# Patient Record
Sex: Male | Born: 1975 | Race: Black or African American | Hispanic: No | Marital: Single | Smoking: Current every day smoker
Health system: Southern US, Community
[De-identification: ages and names within clinical notes are randomized; demographics above are authoritative.]

## PROBLEM LIST (undated history)

## (undated) DIAGNOSIS — H5789 Other specified disorders of eye and adnexa: Secondary | ICD-10-CM

## (undated) HISTORY — PX: TONSILLECTOMY: SUR1361

---

## 2010-01-12 ENCOUNTER — Emergency Department (HOSPITAL_COMMUNITY): Admission: EM | Admit: 2010-01-12 | Discharge: 2010-01-12 | Payer: Self-pay | Admitting: Family Medicine

## 2015-10-24 ENCOUNTER — Emergency Department (HOSPITAL_COMMUNITY): Payer: Self-pay

## 2015-10-24 ENCOUNTER — Encounter (HOSPITAL_COMMUNITY): Payer: Self-pay | Admitting: Emergency Medicine

## 2015-10-24 ENCOUNTER — Emergency Department (HOSPITAL_COMMUNITY)
Admission: EM | Admit: 2015-10-24 | Discharge: 2015-10-25 | Disposition: A | Discharge status: Home / Self Care | Payer: Self-pay | Attending: Emergency Medicine | Admitting: Emergency Medicine

## 2015-10-24 DIAGNOSIS — Z79899 Other long term (current) drug therapy: Secondary | ICD-10-CM | POA: Insufficient documentation

## 2015-10-24 DIAGNOSIS — Y998 Other external cause status: Secondary | ICD-10-CM | POA: Insufficient documentation

## 2015-10-24 DIAGNOSIS — Y9289 Other specified places as the place of occurrence of the external cause: Secondary | ICD-10-CM | POA: Insufficient documentation

## 2015-10-24 DIAGNOSIS — S0101XA Laceration without foreign body of scalp, initial encounter: Secondary | ICD-10-CM | POA: Insufficient documentation

## 2015-10-24 DIAGNOSIS — S50812A Abrasion of left forearm, initial encounter: Secondary | ICD-10-CM | POA: Insufficient documentation

## 2015-10-24 DIAGNOSIS — Y9389 Activity, other specified: Secondary | ICD-10-CM | POA: Insufficient documentation

## 2015-10-24 DIAGNOSIS — S50811A Abrasion of right forearm, initial encounter: Secondary | ICD-10-CM | POA: Insufficient documentation

## 2015-10-24 DIAGNOSIS — F17210 Nicotine dependence, cigarettes, uncomplicated: Secondary | ICD-10-CM | POA: Insufficient documentation

## 2015-10-24 DIAGNOSIS — Z8669 Personal history of other diseases of the nervous system and sense organs: Secondary | ICD-10-CM | POA: Insufficient documentation

## 2015-10-24 DIAGNOSIS — S51012A Laceration without foreign body of left elbow, initial encounter: Secondary | ICD-10-CM | POA: Insufficient documentation

## 2015-10-24 HISTORY — DX: Other specified disorders of eye and adnexa: H57.89

## 2015-10-24 MED ORDER — LIDOCAINE-EPINEPHRINE 2 %-1:100000 IJ SOLN
20.0000 mL | Freq: Once | INTRAMUSCULAR | Status: AC
  Administered 2015-10-24: 20 mL
  Filled 2015-10-24: qty 1
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
## 2015-10-25 MED ORDER — OXYCODONE-ACETAMINOPHEN 5-325 MG PO TABS: 1.0000 | ORAL_TABLET | ORAL | Status: DC | PRN

## 2015-10-25 MED ORDER — BACITRACIN ZINC 500 UNIT/GM EX OINT
TOPICAL_OINTMENT | CUTANEOUS | Status: AC
  Administered 2015-10-25: 1
  Filled 2015-10-25: qty 0.9
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
## 2015-10-31 ENCOUNTER — Encounter (HOSPITAL_COMMUNITY): Payer: Self-pay | Admitting: Emergency Medicine

## 2015-10-31 ENCOUNTER — Emergency Department (HOSPITAL_COMMUNITY)
Admission: EM | Admit: 2015-10-31 | Discharge: 2015-10-31 | Disposition: A | Discharge status: Home / Self Care | Payer: Self-pay | Attending: Emergency Medicine | Admitting: Emergency Medicine

## 2015-10-31 DIAGNOSIS — Z4802 Encounter for removal of sutures: Secondary | ICD-10-CM | POA: Insufficient documentation

## 2015-10-31 DIAGNOSIS — Z79899 Other long term (current) drug therapy: Secondary | ICD-10-CM | POA: Insufficient documentation

## 2015-10-31 DIAGNOSIS — F17210 Nicotine dependence, cigarettes, uncomplicated: Secondary | ICD-10-CM | POA: Insufficient documentation

## 2015-10-31 NOTE — ED Notes (Signed)
Pt was seen here 1 week ago and had staples placed in his head and stitches placed in his elbow. Pt following up to have them removed. A&Ox4 and ambulatory. Denies any issues with sutures or staples.

## 2015-10-31 NOTE — Discharge Instructions (Signed)
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
## 2015-11-04 ENCOUNTER — Emergency Department (HOSPITAL_COMMUNITY)
Admission: EM | Admit: 2015-11-04 | Discharge: 2015-11-04 | Disposition: A | Discharge status: Home / Self Care | Payer: Self-pay | Attending: Emergency Medicine | Admitting: Emergency Medicine

## 2015-11-04 ENCOUNTER — Encounter (HOSPITAL_COMMUNITY): Payer: Self-pay

## 2015-11-04 DIAGNOSIS — Z8669 Personal history of other diseases of the nervous system and sense organs: Secondary | ICD-10-CM | POA: Insufficient documentation

## 2015-11-04 DIAGNOSIS — Z79899 Other long term (current) drug therapy: Secondary | ICD-10-CM | POA: Insufficient documentation

## 2015-11-04 DIAGNOSIS — F17210 Nicotine dependence, cigarettes, uncomplicated: Secondary | ICD-10-CM | POA: Insufficient documentation

## 2015-11-04 DIAGNOSIS — Z4802 Encounter for removal of sutures: Secondary | ICD-10-CM | POA: Insufficient documentation

## 2015-11-04 NOTE — Discharge Instructions (Signed)
Continue to keep it clean. Follow up as needed.    Suture Removal, Care After Refer to this sheet in the next few weeks. These instructions provide you with information on caring for yourself after your procedure. Your health care provider may also give you more specific instructions. Your treatment has been planned according to current medical practices, but problems sometimes occur. Call your health care provider if you have any problems or questions after your procedure. WHAT TO EXPECT AFTER THE PROCEDURE After your stitches (sutures) are removed, it is typical to have the following:  Some discomfort and swelling in the wound area.  Slight redness in the area. HOME CARE INSTRUCTIONS   If you have skin adhesive strips over the wound area, do not take the strips off. They will fall off on their own in a few days. If the strips remain in place after 14 days, you may remove them.  Change any bandages (dressings) at least once a day or as directed by your health care provider. If the bandage sticks, soak it off with warm, soapy water.  Apply cream or ointment only as directed by your health care provider. If using cream or ointment, wash the area with soap and water 2 times a day to remove all the cream or ointment. Rinse off the soap and pat the area dry with a clean towel.  Keep the wound area dry and clean. If the bandage becomes wet or dirty, or if it develops a bad smell, change it as soon as possible.  Continue to protect the wound from injury.  Use sunscreen when out in the sun. New scars become sunburned easily. SEEK MEDICAL CARE IF:  You have increasing redness, swelling, or pain in the wound.  You see pus coming from the wound.  You have a fever.  You notice a bad smell coming from the wound or dressing.  Your wound breaks open (edges not staying together).   This information is not intended to replace advice given to you by your health care provider. Make sure you discuss  any questions you have with your health care provider.   Document Released: 08/14/2001 Document Revised: 09/09/2013 Document Reviewed: 07/01/2013 Elsevier Interactive Patient Education [employer]2016 Elsevier Inc.

## 2015-11-04 NOTE — ED Provider Notes (Signed)
CSN: [id number]     Arrival date & time 11/04/15  [id number] History   First MD Initiated Contact with Patient 11/04/15 0900     Chief Complaint  Patient presents with  . Suture / Staple Removal     (Consider location/radiation/quality/duration/timing/severity/associated sxs/prior Treatment) HPI Kenneth Davies is a 39 y.o. male with no medical problems presents to emergency department requesting suture removal. Patient cut his elbow 10 days ago. He was seen here 3 days ago, had 2 of the sutures removed, and was told to come back to remove the rest. Patient states wound is healing well. No drainage. No redness. Denies any swelling. Denies any tenderness. He has been putting antibiotic ointment and covering with bandages. Denies any other complaints.   Past Medical History  Diagnosis Date  . Eye inflammation    Past Surgical History  Procedure Laterality Date  . Tonsillectomy     History reviewed. No pertinent family history. Social History  Substance Use Topics  . Smoking status: Current Every Day Smoker -- 0.50 packs/day for 15 years    Types: Cigarettes  . Smokeless tobacco: Never Used  . Alcohol Use: Yes    Review of Systems  Constitutional: Negative for fever and chills.  Musculoskeletal: Negative for arthralgias.  Skin: Positive for wound.      Allergies  Review of patient's allergies indicates no known allergies.  Home Medications   Prior to Admission medications   Medication Sig Start Date End Date Taking? Authorizing Provider  acyclovir (ZOVIRAX) 400 MG tablet Take 400 mg by mouth 2 (two) times daily. 09/14/15   Historical Provider, MD  oxyCODONE-acetaminophen (PERCOCET/ROXICET) 5-325 MG tablet Take 1-2 tablets by mouth every 4 (four) hours as needed for severe pain. 10/25/15   Shawn C Joy, PA-C   BP 118/70 mmHg  Pulse 75  Temp(Src) 97.5 F (36.4 C) (Oral)  Resp 14  SpO2 100% Physical Exam  Constitutional: He appears well-developed and well-nourished. No  distress.  Musculoskeletal:  Full rom of the left elbow  Skin: Skin is warm and dry.  Healing laceration to the left elbow, 3 sutures intact. Dermabond noted over the wound. No tenderness to palpation, no erythema, no drainage or dehiscence.  Nursing note and vitals reviewed.   ED Course  Procedures (including critical care time) Labs Review Labs Reviewed - No data to display  Imaging Review No results found. I have personally reviewed and evaluated these images and lab results as part of my medical decision-making.   EKG Interpretation None      MDM   Final diagnoses:  Visit for suture removal   SUTURE REMOVAL Performed by: Jaynie CrumbleKIRICHENKO, Sakura Denis A Consent: Verbal consent obtained. Patient identity confirmed: provided demographic data Time out: Immediately prior to procedure a "time out" was called to verify the correct patient, procedure, equipment, support staff and site/side marked as required. Location: left elbow Wound Appearance: clean Sutures/Staples Removed: 3 Patient tolerance: Patient tolerated the procedure well with no immediate complications.    9:24 AM Sutures removed. Would healing well with no signs of infection or dehiscence. Sterri strips applied for some support since on the joint. Follow up as needed.   ; Filed Vitals:   11/04/15 0857  BP: 118/70  Pulse: 75  Temp: 97.5 F (36.4 C)  Resp: [address]     [name], PA-C 11/04/15 [id number]  [name] Plunkett, MD 11/04/15 1501

## 2015-11-04 NOTE — ED Notes (Signed)
Pt here for remaining sutures removed from elbow.

## 2019-03-31 ENCOUNTER — Other Ambulatory Visit: Payer: Self-pay

## 2019-03-31 ENCOUNTER — Ambulatory Visit (INDEPENDENT_AMBULATORY_CARE_PROVIDER_SITE_OTHER): Payer: BLUE CROSS/BLUE SHIELD | Admitting: Medical

## 2019-03-31 ENCOUNTER — Encounter: Payer: Self-pay | Admitting: Medical

## 2019-03-31 ENCOUNTER — Ambulatory Visit (INDEPENDENT_AMBULATORY_CARE_PROVIDER_SITE_OTHER)
Admission: RE | Admit: 2019-03-31 | Discharge: 2019-03-31 | Disposition: A | Discharge status: Home / Self Care | Payer: BLUE CROSS/BLUE SHIELD | Source: Ambulatory Visit | Attending: Medical | Admitting: Medical

## 2019-03-31 DIAGNOSIS — M79645 Pain in left finger(s): Secondary | ICD-10-CM

## 2019-03-31 MED ORDER — MELOXICAM 7.5 MG PO TABS
7.5000 mg | ORAL_TABLET | Freq: Every day | ORAL | 0 refills | Status: DC
Start: 2019-03-31 — End: 2019-08-19

## 2019-03-31 NOTE — Patient Instructions (Signed)
Pt has history of injury to left thumb/hand for one month and not resolving. Concern for tenosynovitis of left thumb in extensor tendon but limitted flexion of thumb as well. So will get xray today and refer to sports medicine. He can use meloxicam for pain an inflammation. But check bp at pharmacy and let me know if bp over 140/90 since nsaids can increase bp.  Note may need just thumb spica but [place] of thumb can be done to evaluate tendon.  Follow up date to be determined.  Also asked to schedule wellness in mid summer.

## 2019-03-31 NOTE — Progress Notes (Signed)
   Subjective:    Patient ID: Kenneth Davies, male    DOB: January 06, 1976, 43 y.o.   MRN: [id number]  HPI  Virtual Visit via Video Note  I connected with Irene Pap on 03/31/19 at  2:00 PM EDT by a video enabled telemedicine application and verified that I am speaking with the correct person using two identifiers.   I discussed the limitations of evaluation and management by telemedicine and the availability of in person appointments. The patient expressed understanding and agreed to proceed.   No vitals. Pt was at home during visit. I was also. Virtual visit due to covid pandemic. History of Present Illness:  No chronic medical medical. 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  Pt does not exericse regularly. States drinks alcohol quite a bit. 3-4 shot of alcohol a day. Smoke- pack a day recently . Lighter since early 43 yo.  Pt has some left hand pain. He fell on coffee table almost a month ago. He states pain on tope of pain. This is in his dominant hand. Pt has some difficulty fully flexing his thumb.   When first injured the area it was really swollen. Did not want to move it. It was bruised as well.   Observations/Objective: No acute distress. Normal affect. Left hand- normal on inspection. Thumb can't flex fully. But some pain on top of thumb. Some + finklestein test.   Assessment and Plan:  Pt has history of injury to left thumb/hand for one month and not resolving. Concern for tenosynovitis of left thumb in extensor tendon but limitted flexion of thumb as well. So will get xray today and refer to sports medicine. He can use meloxicam for pain an inflammation. But check bp at pharmacy and let me know if bp over 140/90 since nsaids can increase bp.  Note may need just thumb spica but [place] of thumb can be done to evaluate tendon.  Follow up date to be determined.  Also asked to schedule wellness in mid summer.  Esperanza Richters, PA-C Follow Up Instructions:    I discussed  the assessment and treatment plan with the patient. The patient was provided an opportunity to ask questions and all were answered. The patient agreed with the plan and demonstrated an understanding of the instructions.   The patient was advised to call back or seek an in-person evaluation if the symptoms worsen or if the condition fails to improve as anticipated.     Esperanza Richters, PA-C    Review of Systems  Constitutional: Negative for chills, diaphoresis, fatigue and fever.  Respiratory: Negative for cough, chest tightness, shortness of breath and wheezing.   Cardiovascular: Negative for chest pain.  Gastrointestinal: Negative for abdominal pain.  Genitourinary: Negative for dysuria, flank pain, frequency and urgency.  Musculoskeletal: Negative for back pain, neck pain and neck stiffness.  Skin: Negative for rash.  Neurological: Negative for dizziness, speech difficulty and weakness.  Hematological: Negative for adenopathy. Does not bruise/bleed easily.  Psychiatric/Behavioral: Negative for agitation and behavioral problems.       Objective:   Physical Exam        Assessment & Plan:

## 2019-04-01 ENCOUNTER — Encounter: Payer: Self-pay | Admitting: Medical

## 2019-04-07 ENCOUNTER — Ambulatory Visit (INDEPENDENT_AMBULATORY_CARE_PROVIDER_SITE_OTHER): Payer: BLUE CROSS/BLUE SHIELD | Admitting: Family Medicine

## 2019-04-07 ENCOUNTER — Ambulatory Visit: Payer: Self-pay

## 2019-04-07 ENCOUNTER — Other Ambulatory Visit: Payer: Self-pay

## 2019-04-07 ENCOUNTER — Encounter: Payer: Self-pay | Admitting: Family Medicine

## 2019-04-07 VITALS — BP 147/87 | HR 73 | Ht 67.0 in | Wt 135.0 lb

## 2019-04-07 DIAGNOSIS — S63642A Sprain of metacarpophalangeal joint of left thumb, initial encounter: Secondary | ICD-10-CM | POA: Diagnosis not present

## 2019-04-07 DIAGNOSIS — G8929 Other chronic pain: Secondary | ICD-10-CM

## 2019-04-07 DIAGNOSIS — M79645 Pain in left finger(s): Principal | ICD-10-CM

## 2019-04-07 DIAGNOSIS — S63649A Sprain of metacarpophalangeal joint of unspecified thumb, initial encounter: Secondary | ICD-10-CM | POA: Insufficient documentation

## 2019-04-07 MED ORDER — DICLOFENAC SODIUM 2 % TD SOLN: 1.0000 "application " | Freq: Two times a day (BID) | TRANSDERMAL | 2 refills | Status: AC

## 2019-04-07 NOTE — Patient Instructions (Addendum)
Nice to meet you  Please use the thumb spica brace while you are using your hand. You don't need to use it at night.  Please try the rub on medicine.  Please start trying the exercises in 2 weeks.  Please see me back in one month. Please send me a message in MyChart with any questions or updates.

## 2019-04-07 NOTE — Progress Notes (Signed)
Kenneth Davies - 43 y.o. male MRN [id number]  Date of birth: Aug 22, 1976  SUBJECTIVE:  Including CC & ROS.  Chief Complaint  Patient presents with  . Hand Pain    left thumb x 1 months    Kenneth Davies is a 43 y.o. male that is presenting with left ulnar-sided thumb pain.  Has been going on about a month.  He initially fell and had a hyperextension of his thumb.  He is tried resting with no improvement.  The pain is worse with excessive use.  He is left-handed.  The pain is intermediate in nature.  Is localized to the MP joint of the thumb.  Has had some improvement with the meloxicam.  Denies any history of similar symptoms.  Feels like his symptoms are currently staying the same.  He works as a Financial risk analyst.  The pain is sharp and stabbing in nature.  Independent review of the left hand x-ray from 4/28 shows no abnormalities.   Review of Systems  Constitutional: Negative for fever.  HENT: Negative for congestion.   Respiratory: Negative for cough.   Cardiovascular: Negative for chest pain.  Gastrointestinal: Negative for abdominal pain.  Musculoskeletal: Negative for gait problem.  Skin: Negative for color change.  Neurological: Negative for weakness.  Hematological: Negative for adenopathy.    HISTORY: Past Medical, Surgical, Social, and Family History Reviewed & Updated per EMR.   Pertinent Historical Findings include:  Past Medical History:  Diagnosis Date  . Eye inflammation     Past Surgical History:  Procedure Laterality Date  . TONSILLECTOMY      No Known Allergies  No family history on file.   Social History   Socioeconomic History  . Marital status: Single    Spouse name: Not on file  . Number of children: Not on file  . Years of education: Not on file  . Highest education level: Not on file  Occupational History  . Not on file  Social Needs  . Financial resource strain: Not on file  . Food insecurity:    Worry: Not on file    Inability: Not on file  .  Transportation needs:    Medical: Not on file    Non-medical: Not on file  Tobacco Use  . Smoking status: Current Every Day Smoker    Packs/day: 0.50    Years: 15.00    Pack years: 7.50    Types: Cigarettes  . Smokeless tobacco: Never Used  Substance and Sexual Activity  . Alcohol use: Yes    Comment: 3 shots a day.  . Drug use: Yes    Frequency: 3.0 times per week    Types: Marijuana  . Sexual activity: Yes  Lifestyle  . Physical activity:    Days per week: Not on file    Minutes per session: Not on file  . Stress: Not on file  Relationships  . Social connections:    Talks on phone: Not on file    Gets together: Not on file    Attends religious service: Not on file    Active member of club or organization: Not on file    Attends meetings of clubs or organizations: Not on file    Relationship status: Not on file  . Intimate partner violence:    Fear of current or ex partner: Not on file    Emotionally abused: Not on file    Physically abused: Not on file    Forced sexual activity: Not on file  Other Topics Concern  . Not on file  Social History Narrative  . Not on file     PHYSICAL EXAM:  VS: BP (!) 147/87   Pulse 73   Ht 5\' 7"  (1.702 m)   Wt 135 lb (61.2 kg)   BMI 21.14 kg/m  Physical Exam Gen: NAD, alert, cooperative with exam, well-appearing ENT: normal lips, normal nasal mucosa,  Eye: normal EOM, normal conjunctiva and lids CV:  no edema, +2 pedal pulses   Resp: no accessory muscle use, non-labored,   Skin: no rashes, no areas of induration  Neuro: normal tone, normal sensation to touch Psych:  normal insight, alert and oriented MSK:  Left thumb: No effusion. Normal flexion extension. Limited opposition compared to the contralateral side. Some instability with ulnar deviation. Some tenderness palpation of the MP joint. Neurovascular intact  Limited ultrasound: Left thumb:  Mild degenerative changes at [facility]. Effusion of the MP joint.  Insertion of the UCL appears to have a small tear with no significant hypervascularity  Summary: Findings would suggest a gamekeepers or skiers thumb.  Ultrasound and interpretation by Clare GandyJeremy Teeghan Hammer, MD    ASSESSMENT & PLAN:   Gamekeeper's thumb Had a trauma about a month ago with hyperextension of the thumb.  Findings would suggest a gamekeepers or skiers thumb.  Possible to have the capsule of the MP joint strained. -Placed in a thumb spica splint. -Provided Pennsaid. -Counseled on home exercise therapy and can start those in a couple of weeks. -Follow-up in 1 month.  If no improvement need to consider MRI.

## 2019-04-07 NOTE — Assessment & Plan Note (Signed)
Had a trauma about a month ago with hyperextension of the thumb.  Findings would suggest a gamekeepers or skiers thumb.  Possible to have the capsule of the MP joint strained. -Placed in a thumb spica splint. -Provided Pennsaid. -Counseled on home exercise therapy and can start those in a couple of weeks. -Follow-up in 1 month.  If no improvement need to consider MRI.

## 2019-04-13 ENCOUNTER — Encounter: Payer: Self-pay | Admitting: Family Medicine

## 2019-05-05 ENCOUNTER — Ambulatory Visit (INDEPENDENT_AMBULATORY_CARE_PROVIDER_SITE_OTHER): Payer: BC Managed Care – PPO | Admitting: Family Medicine

## 2019-05-05 ENCOUNTER — Other Ambulatory Visit: Payer: Self-pay

## 2019-05-05 ENCOUNTER — Encounter: Payer: Self-pay | Admitting: Family Medicine

## 2019-05-05 DIAGNOSIS — S63642D Sprain of metacarpophalangeal joint of left thumb, subsequent encounter: Secondary | ICD-10-CM

## 2019-05-05 DIAGNOSIS — S63649A Sprain of metacarpophalangeal joint of unspecified thumb, initial encounter: Secondary | ICD-10-CM

## 2019-05-05 NOTE — Patient Instructions (Signed)
Good to see you Please start the range of motion exercises. You will want to perform these before you start cooking. Please try to ice after  Please wear the brace if you are lifting or your thumb could be bent in a different direction   Please send me a message in MyChart with any questions or updates.  Please see me back as needed.   --Dr. Jordan Likes

## 2019-05-05 NOTE — Progress Notes (Signed)
Kenneth Davies - 43 y.o. male MRN [id number]  Date of birth: 01/30/1976  SUBJECTIVE:  Including CC & ROS.  Chief Complaint  Patient presents with  . Follow-up    follow up for left thumb    Kenneth Davies is a 43 y.o. male that is following up for left finger pain.  He has had significant improvement of his symptoms.  He reports some numbness on the radial aspect of his thumb.  He is wearing the thumb spica splint with improvement.  The pain is almost completely gone.  He is also been using the Pennsaid.  He works as a Financial risk analyst.  Symptoms are still localized to the thumb..    Review of Systems  Constitutional: Negative for fever.  HENT: Negative for congestion.   Respiratory: Negative for cough.   Cardiovascular: Negative for chest pain.  Gastrointestinal: Negative for abdominal pain.  Musculoskeletal: Negative for gait problem.  Skin: Negative for color change.  Neurological: Negative for weakness.  Hematological: Negative for adenopathy.    HISTORY: Past Medical, Surgical, Social, and Family History Reviewed & Updated per EMR.   Pertinent Historical Findings include:  Past Medical History:  Diagnosis Date  . Eye inflammation     Past Surgical History:  Procedure Laterality Date  . TONSILLECTOMY      No Known Allergies  No family history on file.   Social History   Socioeconomic History  . Marital status: Single    Spouse name: Not on file  . Number of children: Not on file  . Years of education: Not on file  . Highest education level: Not on file  Occupational History  . Not on file  Social Needs  . Financial resource strain: Not on file  . Food insecurity:    Worry: Not on file    Inability: Not on file  . Transportation needs:    Medical: Not on file    Non-medical: Not on file  Tobacco Use  . Smoking status: Current Every Day Smoker    Packs/day: 0.50    Years: 15.00    Pack years: 7.50    Types: Cigarettes  . Smokeless tobacco: Never Used   Substance and Sexual Activity  . Alcohol use: Yes    Comment: 3 shots a day.  . Drug use: Yes    Frequency: 3.0 times per week    Types: Marijuana  . Sexual activity: Yes  Lifestyle  . Physical activity:    Days per week: Not on file    Minutes per session: Not on file  . Stress: Not on file  Relationships  . Social connections:    Talks on phone: Not on file    Gets together: Not on file    Attends religious service: Not on file    Active member of club or organization: Not on file    Attends meetings of clubs or organizations: Not on file    Relationship status: Not on file  . Intimate partner violence:    Fear of current or ex partner: Not on file    Emotionally abused: Not on file    Physically abused: Not on file    Forced sexual activity: Not on file  Other Topics Concern  . Not on file  Social History Narrative  . Not on file     PHYSICAL EXAM:  VS: BP 138/86   Pulse 76   Ht 5\' 7"  (1.702 m)   Wt 130 lb (59 kg)   BMI 20.36  kg/m  Physical Exam Gen: NAD, alert, cooperative with exam, well-appearing ENT: normal lips, normal nasal mucosa,  Eye: normal EOM, normal conjunctiva and lids CV:  no edema, +2 pedal pulses   Resp: no accessory muscle use, non-labored,   Skin: no rashes, no areas of induration  Neuro: normal tone, normal sensation to touch Psych:  normal insight, alert and oriented MSK:  Left thumb: No swelling or ecchymosis. No specific spot of tenderness. Normal range of motion. No instability with valgus or varus stress testing. Normal strength resistance. Neurovascular intact     ASSESSMENT & PLAN:   Gamekeeper's thumb Seems to have improved with splinting. -Can continue splinting and slowly wean out. -Can continue Pennsaid as needed. -Counseled on home exercise therapy and supportive care. -Can follow-up as needed.

## 2019-05-05 NOTE — Assessment & Plan Note (Signed)
Seems to have improved with splinting. -Can continue splinting and slowly wean out. -Can continue Pennsaid as needed. -Counseled on home exercise therapy and supportive care. -Can follow-up as needed.

## 2019-08-19 ENCOUNTER — Other Ambulatory Visit: Payer: Self-pay

## 2019-08-19 ENCOUNTER — Encounter: Payer: Self-pay | Admitting: Medical

## 2019-08-19 ENCOUNTER — Ambulatory Visit (INDEPENDENT_AMBULATORY_CARE_PROVIDER_SITE_OTHER): Payer: BC Managed Care – PPO | Admitting: Medical

## 2019-08-19 VITALS — BP 140/80 | HR 54 | Temp 98.3°F | Resp 16 | Ht 67.0 in | Wt 131.6 lb

## 2019-08-19 DIAGNOSIS — Z0000 Encounter for general adult medical examination without abnormal findings: Secondary | ICD-10-CM

## 2019-08-19 DIAGNOSIS — R5383 Other fatigue: Secondary | ICD-10-CM | POA: Diagnosis not present

## 2019-08-19 LAB — COMPREHENSIVE METABOLIC PANEL
ALT: 11 U/L (ref 0–53)
AST: 29 U/L (ref 0–37)
Albumin: 4.5 g/dL (ref 3.5–5.2)
Alkaline Phosphatase: 86 U/L (ref 39–117)
BUN: 12 mg/dL (ref 6–23)
CO2: 28 mEq/L (ref 19–32)
Calcium: 9.9 mg/dL (ref 8.4–10.5)
Chloride: 104 mEq/L (ref 96–112)
Creatinine, Ser: 0.88 mg/dL (ref 0.40–1.50)
GFR: 114.52 mL/min (ref >60.00)
Glucose, Bld: 88 mg/dL (ref 70–99)
Potassium: 4.3 mEq/L (ref 3.5–5.1)
Sodium: 140 mEq/L (ref 135–145)
Total Bilirubin: 0.5 mg/dL (ref 0.2–1.2)
Total Protein: 7.1 g/dL (ref 6.0–8.3)

## 2019-08-19 LAB — CBC WITH DIFFERENTIAL/PLATELET
Basophils Absolute: 0 10*3/uL (ref 0.0–0.1)
Basophils Relative: 0.4 % (ref 0.0–3.0)
Eosinophils Absolute: 0.3 10*3/uL (ref 0.0–0.7)
Eosinophils Relative: 5.2 % — ABNORMAL HIGH (ref 0.0–5.0)
HCT: 44.4 % (ref 39.0–52.0)
Hemoglobin: 15.1 g/dL (ref 13.0–17.0)
Lymphocytes Relative: 27.8 % (ref 12.0–46.0)
Lymphs Abs: 1.5 10*3/uL (ref 0.7–4.0)
MCHC: 34 g/dL (ref 30.0–36.0)
MCV: 98.4 fl (ref 78.0–100.0)
Monocytes Absolute: 0.5 10*3/uL (ref 0.1–1.0)
Monocytes Relative: 9 % (ref 3.0–12.0)
Neutro Abs: 3.2 10*3/uL (ref 1.4–7.7)
Neutrophils Relative %: 57.6 % (ref 43.0–77.0)
Platelets: 305 10*3/uL (ref 150.0–400.0)
RBC: 4.51 Mil/uL (ref 4.22–5.81)
RDW: 14.8 % (ref 11.5–15.5)
WBC: 5.5 10*3/uL (ref 4.0–10.5)

## 2019-08-19 LAB — VITAMIN B12: Vitamin B-12: 285 pg/mL (ref 211–911)

## 2019-08-19 LAB — LIPID PANEL
Cholesterol: 161 mg/dL (ref 0–200)
HDL: 91.8 mg/dL (ref >39.00)
LDL Cholesterol: 58 mg/dL (ref 0–99)
NonHDL: 69.01
Total CHOL/HDL Ratio: 2
Triglycerides: 57 mg/dL (ref 0.0–149.0)
VLDL: 11.4 mg/dL (ref 0.0–40.0)

## 2019-08-19 LAB — TSH: TSH: 2.55 u[IU]/mL (ref 0.35–4.50)

## 2019-08-19 MED ORDER — BUPROPION HCL ER (XL) 150 MG PO TB24: 150.0000 mg | ORAL_TABLET | Freq: Every day | ORAL | 1 refills | Status: DC

## 2019-08-19 NOTE — Patient Instructions (Addendum)
For you wellness exam today I have ordered cbc, cmp, tsh, b12, b1 and  lipid panel.  Flu vaccine today.  Recommend exercise and healthy diet.  We will let you know lab results as they come in.  Follow up date appointment will be determined after lab review.   For smoking cessation, rx wellbutrin. Rx advisement given.  Follow up one month or as needed.  Pt bp is borderline. Asked to check 3 times a week. Will review on follow up.   Preventive Care 35-48 Years Old, Male Preventive care refers to lifestyle choices and visits with your health care provider that can promote health and wellness. This includes:  A yearly physical exam. This is also called an annual well check.  Regular dental and eye exams.  Immunizations.  Screening for certain conditions.  Healthy lifestyle choices, such as eating a healthy diet, getting regular exercise, not using drugs or products that contain nicotine and tobacco, and limiting alcohol use. What can I expect for my preventive care visit? Physical exam Your health care provider will check:  Height and weight. These may be used to calculate body mass index (BMI), which is a measurement that tells if you are at a healthy weight.  Heart rate and blood pressure.  Your skin for abnormal spots. Counseling Your health care provider may ask you questions about:  Alcohol, tobacco, and drug use.  Emotional well-being.  Home and relationship well-being.  Sexual activity.  Eating habits.  Work and work Statistician. What immunizations do I need?  Influenza (flu) vaccine  This is recommended every year. Tetanus, diphtheria, and pertussis (Tdap) vaccine  You may need a Td booster every 10 years. Varicella (chickenpox) vaccine  You may need this vaccine if you have not already been vaccinated. Zoster (shingles) vaccine  You may need this after age 38. Measles, mumps, and rubella (MMR) vaccine  You may need at least one dose of MMR if  you were born in 1957 or later. You may also need a second dose. Pneumococcal conjugate (PCV13) vaccine  You may need this if you have certain conditions and were not previously vaccinated. Pneumococcal polysaccharide (PPSV23) vaccine  You may need one or two doses if you smoke cigarettes or if you have certain conditions. Meningococcal conjugate (MenACWY) vaccine  You may need this if you have certain conditions. Hepatitis A vaccine  You may need this if you have certain conditions or if you travel or work in places where you may be exposed to hepatitis A. Hepatitis B vaccine  You may need this if you have certain conditions or if you travel or work in places where you may be exposed to hepatitis B. Haemophilus influenzae type b (Hib) vaccine  You may need this if you have certain risk factors. Human papillomavirus (HPV) vaccine  If recommended by your health care provider, you may need three doses over 6 months. You may receive vaccines as individual doses or as more than one vaccine together in one shot (combination vaccines). Talk with your health care provider about the risks and benefits of combination vaccines. What tests do I need? Blood tests  Lipid and cholesterol levels. These may be checked every 5 years, or more frequently if you are over 44 years old.  Hepatitis C test.  Hepatitis B test. Screening  Lung cancer screening. You may have this screening every year starting at age 71 if you have a 30-pack-year history of smoking and currently smoke or have quit within the  past 15 years.  Prostate cancer screening. Recommendations will vary depending on your family history and other risks.  Colorectal cancer screening. All adults should have this screening starting at age 33 and continuing until age 43. Your health care provider may recommend screening at age 39 if you are at increased risk. You will have tests every 1-10 years, depending on your results and the type of  screening test.  Diabetes screening. This is done by checking your blood sugar (glucose) after you have not eaten for a while (fasting). You may have this done every 1-3 years.  Sexually transmitted disease (STD) testing. Follow these instructions at home: Eating and drinking  Eat a diet that includes fresh fruits and vegetables, whole grains, lean protein, and low-fat dairy products.  Take vitamin and mineral supplements as recommended by your health care provider.  Do not drink alcohol if your health care provider tells you not to drink.  If you drink alcohol: ? Limit how much you have to 0-2 drinks a day. ? Be aware of how much alcohol is in your drink. In the [place] one drink equals one 12 oz bottle of beer (355 mL), one 5 oz glass of wine (148 mL), or one 1 oz glass of hard liquor (44 mL). Lifestyle  Take daily care of your teeth and gums.  Stay active. Exercise for at least 30 minutes on 5 or more days each week.  Do not use any products that contain nicotine or tobacco, such as cigarettes, e-cigarettes, and chewing tobacco. If you need help quitting, ask your health care provider.  If you are sexually active, practice safe sex. Use a condom or other form of protection to prevent STIs (sexually transmitted infections).  Talk with your health care provider about taking a low-dose aspirin every day starting at age 58. What's next?  Go to your health care provider once a year for a well check visit.  Ask your health care provider how often you should have your eyes and teeth checked.  Stay up to date on all vaccines. This information is not intended to replace advice given to you by your health care provider. Make sure you discuss any questions you have with your health care provider. Document Released: 12/16/2015 Document Revised: 11/13/2018 Document Reviewed: 11/13/2018 Elsevier Patient Education  2020 [employer].

## 2019-08-19 NOTE — Progress Notes (Signed)
Subjective:    Patient ID: Kenneth Davies, male    DOB: February 22, 1976, 43 y.o.   MRN: [id number]  HPI  Patient here for annual physical. No acute complaints or concerns. No medical history or chronic medications. 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 Some minor stress related to the pandemic and wedding planning, but is able to manage it well on his own. Reports strong support system.   Reports eating 1-2 meals per day, somewhat healthy, denies regular exercise. Pt tried to quit in past but started.  Patient smokes 0.5-1 pk per day for the past 20 years and is interested in something to help quit.   Reports alcohol and marijuana use 3-4 times per week. Pt drinks about 3-4 times a week. Drinks large bottle of whisky spread out over entire week..  Will get flu vaccine today. Would like to wait on tetanus vaccine. Declines HIV screening.  Fasting for labs.   Pt will get flu vaccine.   Review of Systems  Constitutional: Negative for chills, fatigue and fever.  HENT: Negative for congestion, rhinorrhea and sore throat.   Eyes: Negative for pain and visual disturbance.  Respiratory: Negative for cough, chest tightness and shortness of breath.   Cardiovascular: Negative for chest pain and palpitations.  Gastrointestinal: Negative for abdominal pain, blood in stool, constipation, diarrhea and nausea.  Endocrine: Negative for polydipsia, polyphagia and polyuria.  Genitourinary: Negative for dysuria, frequency, hematuria and urgency.  Musculoskeletal: Negative for arthralgias and myalgias.  Skin: Negative for color change and rash.  Neurological: Negative for dizziness and headaches.  Psychiatric/Behavioral: Negative for behavioral problems, self-injury and suicidal ideas. The patient is not nervous/anxious.        Minor stress, managed well     Past Medical History:  Diagnosis Date  . Eye inflammation      Social History   Socioeconomic History  .  Marital status: Single    Spouse name: Not on file  . Number of children: Not on file  . Years of education: Not on file  . Highest education level: Not on file  Occupational History  . Not on file  Social Needs  . Financial resource strain: Not on file  . Food insecurity    Worry: Not on file    Inability: Not on file  . Transportation needs    Medical: Not on file    Non-medical: Not on file  Tobacco Use  . Smoking status: Current Every Day Smoker    Packs/day: 0.50    Years: 15.00    Pack years: 7.50    Types: Cigarettes  . Smokeless tobacco: Never Used  Substance and Sexual Activity  . Alcohol use: Yes    Comment: 3 shots a day.  . Drug use: Yes    Frequency: 3.0 times per week    Types: Marijuana  . Sexual activity: Yes  Lifestyle  . Physical activity    Days per week: Not on file    Minutes per session: Not on file  . Stress: Not on file  Relationships  . Social Herbalist on phone: Not on file    Gets together: Not on file    Attends religious service: Not on file    Active member of club or organization: Not on file    Attends meetings of clubs or organizations: Not on file    Relationship status: Not on file  . Intimate partner violence  Fear of current or ex partner: Not on file    Emotionally abused: Not on file    Physically abused: Not on file    Forced sexual activity: Not on file  Other Topics Concern  . Not on file  Social History Narrative  . Not on file    Past Surgical History:  Procedure Laterality Date  . TONSILLECTOMY      Family History  Problem Relation Age of Onset  . Cancer Mother 6445       breast cancer  . Dementia Father 4160    No Known Allergies  Current Outpatient Medications on File Prior to Visit  Medication Sig Dispense Refill  . Diclofenac Sodium (PENNSAID) 2 % SOLN Place 1 application onto the skin 2 (two) times daily. 1 Bottle 2   No current facility-administered medications on file prior to visit.      BP (!) 146/90   Pulse (!) 54   Temp 98.3 F (36.8 C) (Temporal)   Resp 16   Ht 5\' 7"  (1.702 m)   Wt 131 lb 9.6 oz (59.7 kg)   SpO2 100%   BMI 20.61 kg/m       Objective:   Physical Exam  General Mental Status- Alert. General Appearance- Not in acute distress.   Skin General: Color- Normal Color. Moisture- Normal Moisture.  Neck Carotid Arteries- Normal color. Moisture- Normal Moisture. No carotid bruits. No JVD.  Chest and Lung Exam Auscultation: Breath Sounds:-Normal.  Cardiovascular Auscultation:Rythm- Regular. Murmurs & Other Heart Sounds:Auscultation of the heart reveals- No Murmurs.  Abdomen Inspection:-Inspeection Normal. Palpation/Percussion:Note:No mass. Palpation and Percussion of the abdomen reveal- Non Tender, Non Distended + BS, no rebound or guarding.   Neurologic Cranial Nerve exam:- CN III-XII intact(No nystagmus), symmetric smile. Strength:- 5/5 equal and symmetric strength both upper and lower extremities.      Assessment & Plan:  For you wellness exam today I have ordered cbc, cmp, tsh, b12, b1 and  lipid panel.  Flu vaccine today.  Recommend exercise and healthy diet.  We will let you know lab results as they come in.  Follow up date appointment will be determined after lab review.   For smoking cessation, rx wellbutrin. Rx advisement given.  Encourage after stop smoking cut back/stop alcohol if possible. Also consider stopping marijuana use.  Follow up one month or as needed.  Pt bp is borderline. Asked to check 3 times a week. Will review on follow up.  Esperanza RichtersEdward Airyanna Dipalma, PA-C

## 2019-08-22 LAB — VITAMIN B1: Vitamin B1 (Thiamine): <6 nmol/L — ABNORMAL LOW (ref 8–30)

## 2019-09-21 ENCOUNTER — Other Ambulatory Visit: Payer: Self-pay

## 2019-09-21 ENCOUNTER — Ambulatory Visit: Payer: BC Managed Care – PPO | Admitting: Medical

## 2019-11-03 ENCOUNTER — Other Ambulatory Visit: Payer: Self-pay

## 2019-11-03 MED ORDER — BUPROPION HCL ER (XL) 150 MG PO TB24: 150.0000 mg | ORAL_TABLET | Freq: Every day | ORAL | 1 refills | Status: AC

## 2020-02-12 ENCOUNTER — Other Ambulatory Visit (HOSPITAL_COMMUNITY)
Admission: RE | Admit: 2020-02-12 | Discharge: 2020-02-12 | Disposition: A | Discharge status: Home / Self Care | Payer: BC Managed Care – PPO | Source: Ambulatory Visit | Attending: Internal Medicine | Admitting: Internal Medicine

## 2020-02-12 ENCOUNTER — Ambulatory Visit (INDEPENDENT_AMBULATORY_CARE_PROVIDER_SITE_OTHER): Payer: BC Managed Care – PPO | Admitting: Internal Medicine

## 2020-02-12 ENCOUNTER — Telehealth: Payer: Self-pay | Admitting: Internal Medicine

## 2020-02-12 ENCOUNTER — Other Ambulatory Visit: Payer: Self-pay

## 2020-02-12 ENCOUNTER — Encounter: Payer: Self-pay | Admitting: Internal Medicine

## 2020-02-12 VITALS — Ht 67.0 in | Wt 130.0 lb

## 2020-02-12 DIAGNOSIS — N410 Acute prostatitis: Secondary | ICD-10-CM

## 2020-02-12 DIAGNOSIS — R300 Dysuria: Secondary | ICD-10-CM | POA: Insufficient documentation

## 2020-02-12 DIAGNOSIS — R350 Frequency of micturition: Secondary | ICD-10-CM

## 2020-02-12 LAB — PSA: PSA: 13.41 ng/mL — ABNORMAL HIGH (ref 0.10–4.00)

## 2020-02-12 MED ORDER — SULFAMETHOXAZOLE-TRIMETHOPRIM 800-160 MG PO TABS: 1.0000 | ORAL_TABLET | Freq: Two times a day (BID) | ORAL | 0 refills | Status: AC

## 2020-02-12 NOTE — Progress Notes (Signed)
   Subjective:    Patient ID: Kenneth Davies, male    DOB: 1976/03/06, 44 y.o.   MRN: [id number]  DOS:  02/12/2020 Type of visit - description: Virtual Visit via Video Note  I connected with the above patient  by a video enabled telemedicine application and verified that I am speaking with the correct person using two identifiers.   THIS ENCOUNTER IS A VIRTUAL VISIT DUE TO COVID-19 - PATIENT WAS NOT SEEN IN THE OFFICE. PATIENT HAS CONSENTED TO VIRTUAL VISIT / TELEMEDICINE VISIT   Location of patient: home  Location of provider: office  I discussed the limitations of evaluation and management by telemedicine and the availability of in person appointments. The patient expressed understanding and agreed to proceed.  Acute Symptoms started 2 days ago with dysuria, urinary frequency and a sense that he cannot empty the bladder completely. No previous symptoms like this, no history of UTIs. He is on a monogamus relationship with a male.   Review of Systems Denies fever chills. No weight loss or visual disturbances No nausea or vomiting No flank or abdominal pain No penile discharge or genital rash. No gross hematuria but at some point in the urine look pink  Past Medical History:  Diagnosis Date  . Eye inflammation     Past Surgical History:  Procedure Laterality Date  . TONSILLECTOMY      Allergies as of 02/12/2020   No Known Allergies     Medication List       Accurate as of February 12, 2020 11:44 AM. If you have any questions, ask your nurse or doctor.        buPROPion 150 MG 24 hr tablet Commonly known as: WELLBUTRIN XL Take 1 tablet (150 mg total) by mouth daily.   Diclofenac Sodium 2 % Soln Commonly known as: Pennsaid Place 1 application onto the skin 2 (two) times daily.          Objective:   Physical Exam Ht 5\' 7"  (1.702 m)   Wt 130 lb (59 kg)   BMI 20.36 kg/m  This is a virtual video visit, he is alert oriented x3, in no apparent distress      Assessment    UTI, prostatitis? 44 year old gentleman, healthy, presents with urinary symptoms for 2 days. DDx includes UTI, prostatitis and much less likely STDs. Plan: UA, urine culture, gonorrhea chlamydia in urine, PSA.  Further advised with results.  I discussed the assessment and treatment plan with the patient. The patient was provided an opportunity to ask questions and all were answered. The patient agreed with the plan and demonstrated an understanding of the instructions.   The patient was advised to call back or seek an in-person evaluation if the symptoms worsen or if the condition fails to improve as anticipated.

## 2020-02-12 NOTE — Telephone Encounter (Signed)
Left message to return call. Sent patient [employer],.

## 2020-02-12 NOTE — Telephone Encounter (Signed)
PSA 13, UA urine culture pending. Given symptoms and elevated PSA he likely has prostatitis. Please let the patient know of above Send Bactrim DS 1 tablet twice a day #30 no refills. Needs a minimum of 2 weeks of treatment possibly 3 or 4 weeks in total. Needs to follow-up with PCP in 2 weeks.

## 2020-02-14 ENCOUNTER — Telehealth: Payer: Self-pay | Admitting: Medical

## 2020-02-14 LAB — URINE CULTURE
MICRO NUMBER:: 10245616
SPECIMEN QUALITY:: ADEQUATE

## 2020-02-14 MED ORDER — NITROFURANTOIN MONOHYD MACRO 100 MG PO CAPS: 100.0000 mg | ORAL_CAPSULE | Freq: Two times a day (BID) | ORAL | 0 refills | Status: AC

## 2020-02-15 NOTE — Telephone Encounter (Signed)
Opened to rx macrobid.

## 2020-02-17 LAB — URINE CYTOLOGY ANCILLARY ONLY
Chlamydia: NEGATIVE
Comment: NEGATIVE
Comment: NEGATIVE
Comment: NORMAL
Neisseria Gonorrhea: NEGATIVE
Trichomonas: NEGATIVE

## 2020-09-08 IMAGING — DX LEFT HAND - 2 VIEW
2 series · 2 of 2 positions shown · non-contrast
Comparison: None.

CLINICAL DATA: Hand pain since a fall 1 month ago.

EXAM:
LEFT HAND - 2 VIEW

[hand ap]
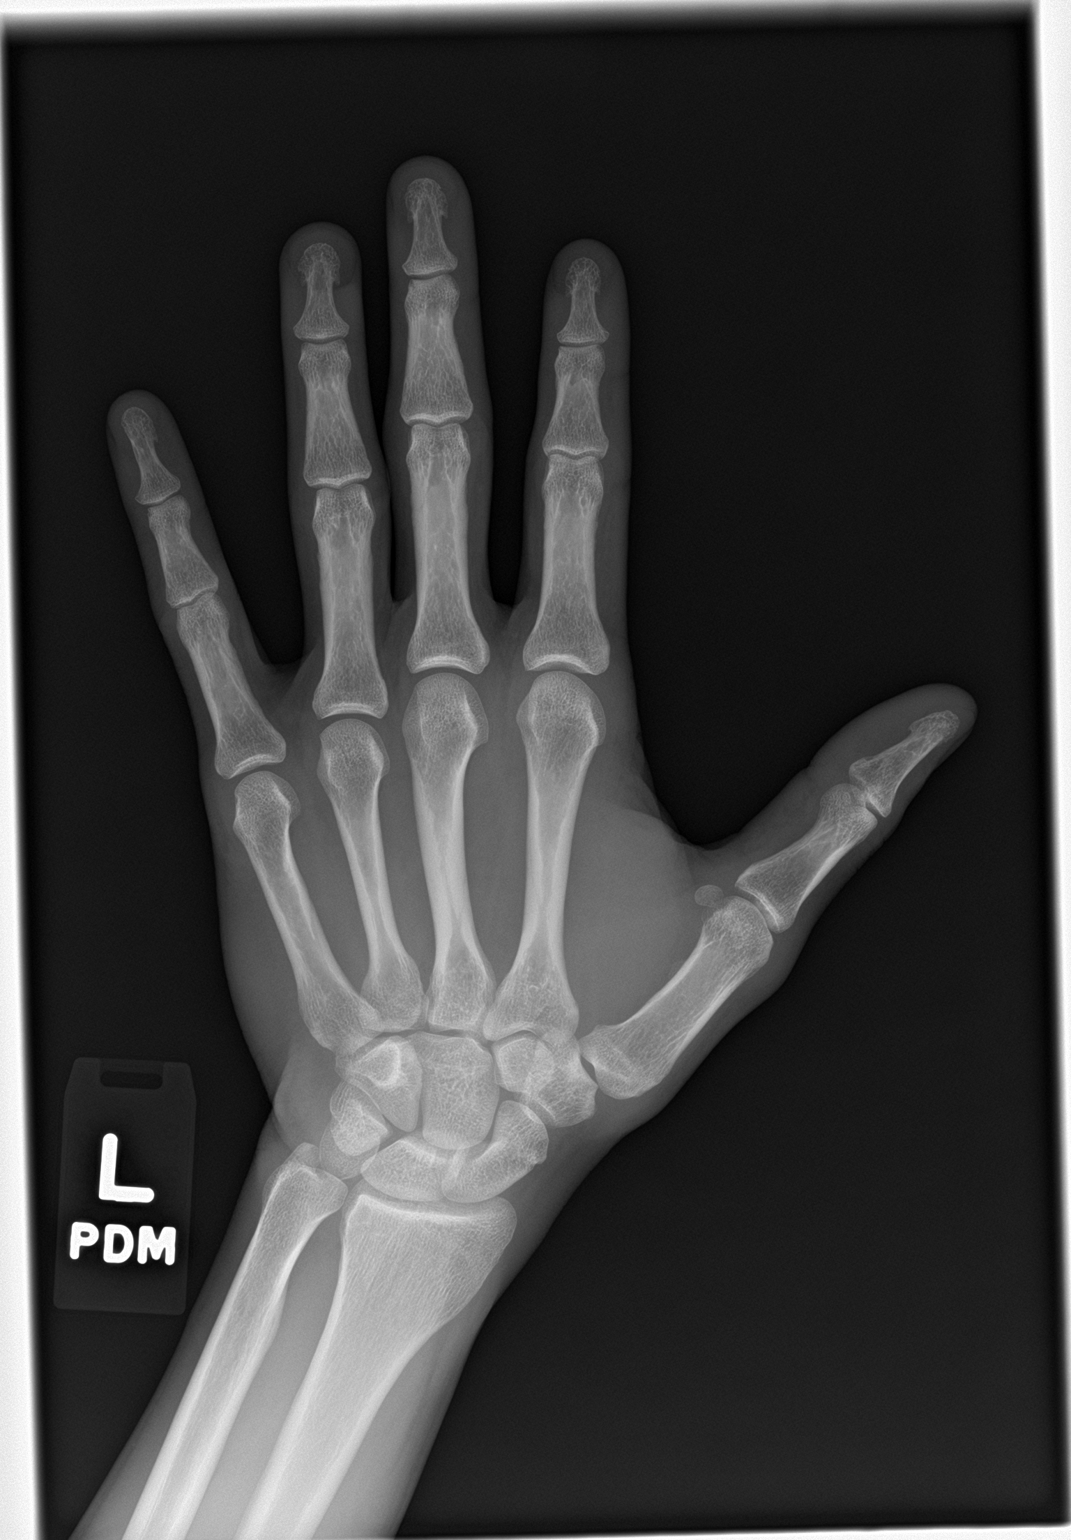

[hand lat]
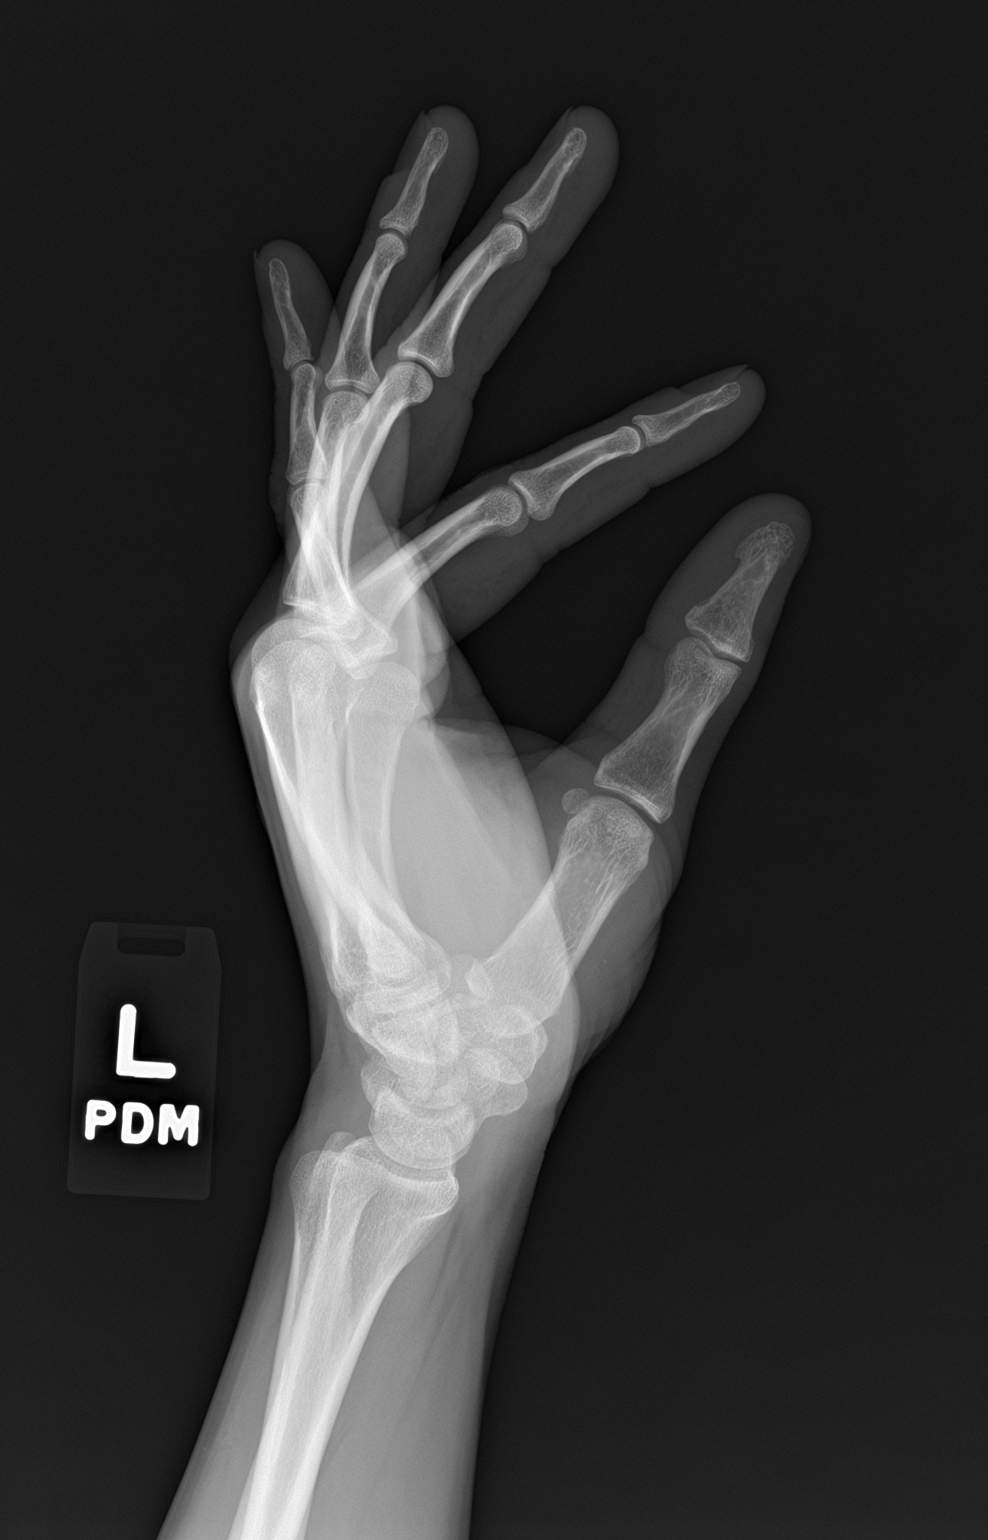

[2 of 2 positions shown; findings below may reference images not displayed]

FINDINGS: There is no evidence of fracture or dislocation. There is no
evidence of arthropathy or other focal bone abnormality. Soft
tissues are unremarkable.
IMPRESSION: Negative.

## 2023-03-18 ENCOUNTER — Encounter: Payer: Self-pay | Admitting: *Deleted
